# Patient Record
Sex: Male | Born: 1995 | Race: White | Hispanic: No | Marital: Married | Smoking: Never smoker
Health system: Southern US, Community
[De-identification: ages and names within clinical notes are randomized; demographics above are authoritative.]

## PROBLEM LIST (undated history)

## (undated) DIAGNOSIS — I456 Pre-excitation syndrome: Secondary | ICD-10-CM

## (undated) HISTORY — PX: TYMPANOSTOMY TUBE PLACEMENT: SHX32

---

## 2018-04-03 ENCOUNTER — Encounter: Payer: Self-pay | Admitting: Emergency Medicine

## 2018-04-03 ENCOUNTER — Emergency Department: Payer: 59

## 2018-04-03 ENCOUNTER — Emergency Department
Admission: EM | Admit: 2018-04-03 | Discharge: 2018-04-03 | Disposition: A | Discharge status: Home / Self Care | Payer: 59 | Attending: Emergency Medicine | Admitting: Emergency Medicine

## 2018-04-03 ENCOUNTER — Other Ambulatory Visit: Payer: Self-pay

## 2018-04-03 DIAGNOSIS — R002 Palpitations: Secondary | ICD-10-CM | POA: Diagnosis present

## 2018-04-03 HISTORY — DX: Pre-excitation syndrome: I45.6

## 2018-04-03 LAB — CBC
HCT: 43 % (ref 40.0–52.0)
Hemoglobin: 15 g/dL (ref 13.0–18.0)
MCH: 29.9 pg (ref 26.0–34.0)
MCHC: 34.8 g/dL (ref 32.0–36.0)
MCV: 86 fL (ref 80.0–100.0)
PLATELETS: 270 10*3/uL (ref 150–440)
RBC: 5 MIL/uL (ref 4.40–5.90)
RDW: 12.9 % (ref 11.5–14.5)
WBC: 6.1 10*3/uL (ref 3.8–10.6)

## 2018-04-03 LAB — BASIC METABOLIC PANEL
Anion gap: 8 (ref 5–15)
BUN: 20 mg/dL (ref 6–20)
CALCIUM: 9 mg/dL (ref 8.9–10.3)
CHLORIDE: 108 mmol/L (ref 101–111)
CO2: 23 mmol/L (ref 22–32)
CREATININE: 0.86 mg/dL (ref 0.61–1.24)
Glucose, Bld: 93 mg/dL (ref 65–99)
Potassium: 3.7 mmol/L (ref 3.5–5.1)
SODIUM: 139 mmol/L (ref 135–145)

## 2018-04-03 LAB — TROPONIN I: Troponin I: <0.03 ng/mL (ref <0.03)

## 2018-04-03 LAB — TSH: TSH: 2.259 u[IU]/mL (ref 0.350–4.500)

## 2018-04-03 NOTE — ED Notes (Signed)
Pt updated on turn around time for troponin. Pt verbalizes understanding.

## 2018-04-03 NOTE — ED Notes (Signed)
Report to kim, rn.  

## 2018-04-03 NOTE — ED Notes (Signed)
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 Pt states tonight while driving to work he felt palpitations. Pt states he arrived at work and discussed symptoms with EMT at work who encouraged pt to check into ed. Pt states after experiencing palpitations he began to have chest tightness.

## 2018-04-03 NOTE — Discharge Instructions (Addendum)
Please follow-up with cardiology for further evaluation and testing of your heart

## 2018-04-03 NOTE — ED Provider Notes (Signed)
[facility] Emergency Department Provider Note   ____________________________________________   First MD Initiated Contact with Patient 04/03/18 [phone]     (approximate)  I have reviewed the triage vital signs and the nursing notes.   HISTORY  Chief Complaint Palpitations    HPI Patrick Santiago is a 22 y.o. male who comes into the hospital today with some palpitations.  The patient states that they started around 11:20 PM.  He was on his way to work.  He started feeling a fluttering in his chest.  The patient has had these symptoms before but states not in years.  The patient states that initially it was just fluttering but then the second time he had episode it felt a little fast and then the third time he had chest pressure.  The patient states that he called the ambulance and the EMT told him to come and get checked out.  The patient states that since then he is been having these episodes intermittently.  The patient denies any shortness of breath and denies any chest pain at this time.  He has had some nausea with no sweats.  He has been eating and drinking well.  Past Medical History:  Diagnosis Date  . 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   History reviewed. No pertinent surgical history.  Prior to Admission medications   Not on File    Allergies Patient has no known allergies.  No family history on file.  Social History Social History   Tobacco Use  . Smoking status: Never Smoker  . Smokeless tobacco: Never Used  Substance Use Topics  . Alcohol use: Never    Frequency: Never  . Drug use: Never    Review of Systems  Constitutional: No fever/chills Eyes: No visual changes. ENT: No sore throat. Cardiovascular: Palpitations and chest pressure Respiratory: Denies shortness of breath. Gastrointestinal: No abdominal pain.  No nausea, no vomiting.  No  diarrhea.  No constipation. Genitourinary: Negative for dysuria. Musculoskeletal: Negative for back pain. Skin: Negative for rash. Neurological: Negative for headaches, focal weakness or numbness.   ____________________________________________   PHYSICAL EXAM:  VITAL SIGNS: ED Triage Vitals  Enc Vitals Group     BP 04/03/18 0051 122/76     Pulse Rate 04/03/18 0051 80     Resp 04/03/18 0051 18     Temp 04/03/18 0051 98.4 F (36.9 C)     Temp Source 04/03/18 0051 Oral     SpO2 04/03/18 0051 98 %     Weight 04/03/18 0052 125 lb (56.7 kg)     Height 04/03/18 0052  (1.753 m)     Head Circumference --      Peak Flow --      Pain Score 04/03/18 0052 7     Pain Loc --      Pain Edu? --      Excl. in GC? --     Constitutional: Alert and oriented. Well appearing and in no acute distress. Eyes: Conjunctivae are normal. PERRL. EOMI. Head: Atraumatic. Nose: No congestion/rhinnorhea. Mouth/Throat: Mucous membranes are moist.  Oropharynx non-erythematous. Cardiovascular: Normal rate, regular rhythm. Grossly normal heart sounds.  Good peripheral circulation. Respiratory: Normal respiratory effort.  No retractions. Lungs CTAB. Gastrointestinal: Soft and nontender. No distention positive bowel sounds Musculoskeletal: No lower extremity tenderness nor edema.   Neurologic:  Normal speech and language.  Skin:  Skin is  warm, dry and intact.  Psychiatric: Mood and affect are normal.   ____________________________________________   LABS (all labs ordered are listed, but only abnormal results are displayed)  Labs Reviewed  BASIC METABOLIC PANEL  CBC  TROPONIN I  TROPONIN I  TSH   ____________________________________________  EKG  ED ECG REPORT I, Rebecka Apley, the attending physician, personally viewed and interpreted this ECG.   Date: 04/03/2018  EKG Time: 704  Rate: 59  Rhythm: normal sinus rhythm  Axis: normal  Intervals:none  ST&T Change:  none  ____________________________________________  RADIOLOGY  ED MD interpretation:  CXR: no active cardiopulmonary disease  Official radiology report(s): Dg Chest 2 View  Result Date: 04/03/2018 CLINICAL DATA:  Heart palpitations. History of Wolff Parkinson syndrome. EXAM: CHEST - 2 VIEW COMPARISON:  None. FINDINGS: The heart size and mediastinal contours are within normal limits. Both lungs are clear. The visualized skeletal structures are unremarkable. IMPRESSION: No active cardiopulmonary disease. Electronically Signed   By: Tollie Eth M.D.   On: 04/03/2018 01:46    ____________________________________________   PROCEDURES  Procedure(s) performed: None  Procedures  Critical Care performed: No  ____________________________________________   INITIAL IMPRESSION / ASSESSMENT AND PLAN / ED COURSE  As part of my medical decision making, I reviewed the following data within the electronic MEDICAL RECORD NUMBER Notes from prior ED visits and [place] Controlled Substance Database   This is a 22 year old male who comes into the hospital today with palpitations.  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  My differential diagnosis includes acute coronary syndrome, arrhythmia, SVT  The patient did receive an EKG which showed a sinus rhythm with no tachycardia.  The patient also has not had any PVCs.  He has not had any increased caffeine intake.  We will check some blood work on the patient to include a CBC BMP and a troponin which was all negative.  The patient had a chest x-ray also that was negative.  The patient will be discharged home to follow-up with cardiology.      ____________________________________________   FINAL CLINICAL IMPRESSION(S) / ED DIAGNOSES  Final diagnoses:  Palpitations     ED Discharge Orders    None       Note:  This document was prepared using Dragon voice recognition software and may include unintentional dictation  errors.    Rebecka Apley, MD 04/03/18 [phone]

## 2018-04-03 NOTE — ED Triage Notes (Signed)
Pt reports that when he was on the way to work this evening, he began to experience palpitations. Pt has hx of Wolf Parkinson's syndrome. Pt is ambulatory to triage and in NAD.

## 2018-04-15 ENCOUNTER — Other Ambulatory Visit: Payer: Self-pay

## 2018-04-15 ENCOUNTER — Emergency Department: Payer: 59

## 2018-04-15 ENCOUNTER — Emergency Department
Admission: EM | Admit: 2018-04-15 | Discharge: 2018-04-15 | Disposition: A | Discharge status: Home / Self Care | Payer: 59 | Attending: Emergency Medicine | Admitting: Emergency Medicine

## 2018-04-15 DIAGNOSIS — R0789 Other chest pain: Secondary | ICD-10-CM | POA: Insufficient documentation

## 2018-04-15 NOTE — Discharge Instructions (Addendum)
Return to the ER for new, worsening, or persistent severe chest pain especially sustained chest pain, difficulty breathing, lightheadedness, or any other new or worsening symptoms that concern you.  Follow-up with Dr. Juliann Pares as scheduled.  Make sure you are getting enough rest, drinking plenty of fluids, eating regularly, and you should avoid caffeine, excessive alcohol, or drugs, all of which could cause worsening of your symptoms.

## 2018-04-15 NOTE — ED Triage Notes (Signed)
Pt arrives to ED via POV from work with c/o CP. Pt seen here recently for palpitations; has an appt for testing with Cardiologist June 6th. Pt reports some dizziness, but denies N/V/D. Pt is ambulatory, in NAD; RR even, regular, and unlabored.

## 2018-04-15 NOTE — ED Provider Notes (Signed)
[facility] Emergency Department Provider Note ____________________________________________   First MD Initiated Contact with Patient 04/15/18 [phone]     (approximate)  I have reviewed the triage vital signs and the nursing notes.   HISTORY  Chief Complaint Chest Pain    HPI Patrick Santiago is a 22 y.o. male with history of WPW who presents with chest pain, acute onset approximately 3 hours ago, described as a sharp pinprick type sensation occurring a few times, and now resolved.  Patient has had palpitations for a few weeks, and was previously seen in the ED.  He saw Dr. Juliann Pares, and has actually been wearing a Holter monitor for the last few days, but he took it off right before he went to work tonight (and thus right before the pain started).  He reports still having the palpitations now, but denies any other acute symptoms.  Past Medical History:  Diagnosis Date  . 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   History reviewed. No pertinent surgical history.  Prior to Admission medications   Not on File    Allergies Patient has no known allergies.  No family history on file.  Social History Social History   Tobacco Use  . Smoking status: Never Smoker  . Smokeless tobacco: Never Used  Substance Use Topics  . Alcohol use: Never    Frequency: Never  . Drug use: Never    Review of Systems  Constitutional: No fever. Eyes: No v redness. ENT: No neck pain. Cardiovascular: Positive for resolved chest pain. Respiratory: Denies shortness of breath. Gastrointestinal: No nausea or vomiting. Genitourinary: Negative for flank pain.  Musculoskeletal: Negative for back pain. Skin: Negative for rash. Neurological: Negative for lightheadedness.   ____________________________________________   PHYSICAL EXAM:  VITAL SIGNS: ED Triage Vitals  Enc Vitals Group     BP  04/15/18 0330 130/66     Pulse Rate 04/15/18 0330 68     Resp 04/15/18 0330 18     Temp 04/15/18 0330 98.4 F (36.9 C)     Temp Source 04/15/18 0330 Oral     SpO2 04/15/18 0330 100 %     Weight 04/15/18 0319 126 lb (57.2 kg)     Height 04/15/18 0319  (1.753 m)     Head Circumference --      Peak Flow --      Pain Score 04/15/18 0319 4     Pain Loc --      Pain Edu? --      Excl. in GC? --     Constitutional: Alert and oriented.  Anxious but otherwise well-appearing. Eyes: Conjunctivae are normal.  Head: Atraumatic. Nose: No congestion/rhinnorhea. Mouth/Throat: Mucous membranes are moist.   Neck: Normal range of motion.  Cardiovascular: Normal rate, regular rhythm. Grossly normal heart sounds.  Good peripheral circulation. Respiratory: Normal respiratory effort.  No retractions. Lungs CTAB. Gastrointestinal:  No distention.  Musculoskeletal: No lower extremity edema.  Extremities warm and well perfused.  No calf or popliteal swelling or tenderness. Neurologic:  Normal speech and language. No gross focal neurologic deficits are appreciated.  Skin:  Skin is warm and dry. No rash noted. Psychiatric: Mood and affect are normal. Speech and behavior are normal.  Anxious appearing.  ____________________________________________   LABS (all labs ordered are listed, but only abnormal results are displayed)  Labs Reviewed - No data to display ____________________________________________  EKG  ED  ECG REPORT I, Dionne Bucy, the attending physician, personally viewed and interpreted this ECG.  Date: 04/15/2018 EKG Time: 328 Rate: 66 Rhythm: normal sinus rhythm QRS Axis: normal Intervals: Short PR ST/T Wave abnormalities: normal Narrative Interpretation: no evidence of acute ischemia; no significant change when compared to EKG of 04/03/2018  ED ECG REPORT I, Dionne Bucy, the attending physician, personally viewed and interpreted this ECG.  Date:  04/15/2018 EKG Time: 506 Rate: 65 Rhythm: normal sinus rhythm QRS Axis: normal Intervals: Short PR ST/T Wave abnormalities: normal Narrative Interpretation: No dynamic changes when compared to EKG of 328 today     ____________________________________________  RADIOLOGY  CXR: No focal infiltrate or other acute findings  ____________________________________________   PROCEDURES  Procedure(s) performed: No  Procedures  Critical Care performed: No ____________________________________________   INITIAL IMPRESSION / ASSESSMENT AND PLAN / ED COURSE  Pertinent labs & imaging results that were available during my care of the patient were reviewed by me and considered in my medical decision making (see chart for details).  22 year old male with PMH as noted above presents with palpitations and very atypical chest pain acute onset a few hours ago and now resolved.  Patient is currently undergoing a cardiology work-up for palpitations, and was actually wearing a Holter monitor until right before the symptoms started this evening.  He has cardiology follow-up arranged in 6 days, and has a stress test and echocardiogram scheduled.  On exam, the vital signs are normal, the patient is extremely anxious appearing, but the remainder the exam is unremarkable.  Overall presentation is consistent with an exacerbation of patient's chronic palpitations.  Given his age, lack of ACS risk factors, lack of EKG changes, and the highly atypical nature of the pain, there is no clinical evidence for ACS.  We discussed obtaining basic labs and a troponin to fully rule out ACS, however the patient reports that he has extreme anxiety related to having his blood drawn and would strongly prefer not to have a blood draw unless absolutely necessary.  Given the low risk for ACS as noted above, I feel that it would be reasonable to observe the patient briefly and obtain a repeat EKG and forego blood at this time.   The patient agrees with this plan.    ----------------------------------------- 5:19 AM on 04/15/2018 -----------------------------------------  On reassessment, patient's symptoms have resolved.  He is much more comfortable appearing.  Repeat EKG is unchanged.  At this time, the patient is stable for discharge home.  I had a thorough discussion with the patient about the results of his work-up and the follow-up plan.  Return precautions given, and the patient expresses understanding.  ____________________________________________   FINAL CLINICAL IMPRESSION(S) / ED DIAGNOSES  Final diagnoses:  Atypical chest pain      NEW MEDICATIONS STARTED DURING THIS VISIT:  New Prescriptions   No medications on file     Note:  This document was prepared using Dragon voice recognition software and may include unintentional dictation errors.    Dionne Bucy, MD 04/15/18 [phone]

## 2019-04-04 IMAGING — CR DG CHEST 2V
1 series · 2 of 2 positions shown · non-contrast
Comparison: 04/03/2018 chest radiograph.

CLINICAL DATA: 22 y/o  M; chest pain and dizziness.

EXAM:
CHEST - 2 VIEW

[Series 1: dg chest 2 view · 0.14mm/px · 2 of 2 slices shown]
[im 1/2]
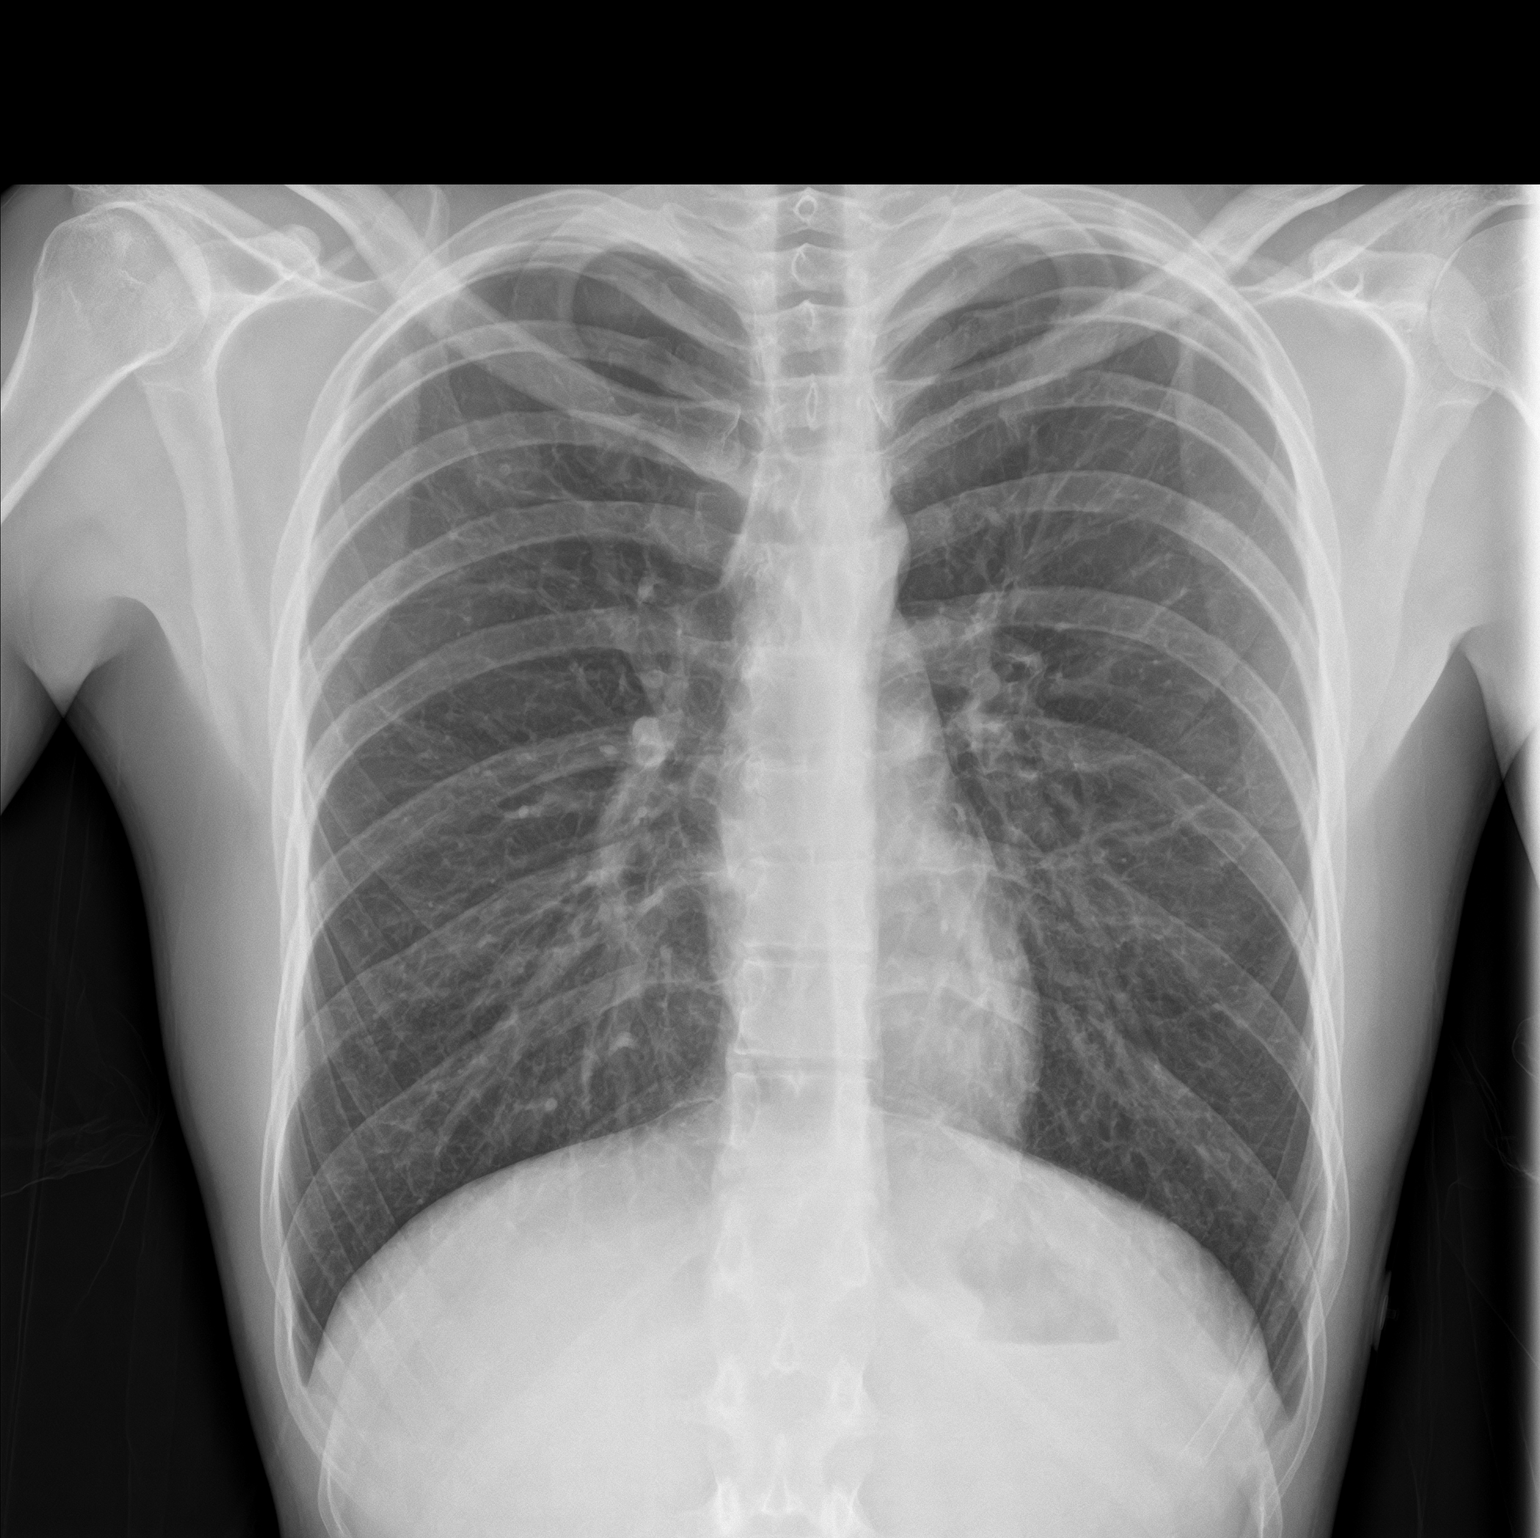
[im 2/2]
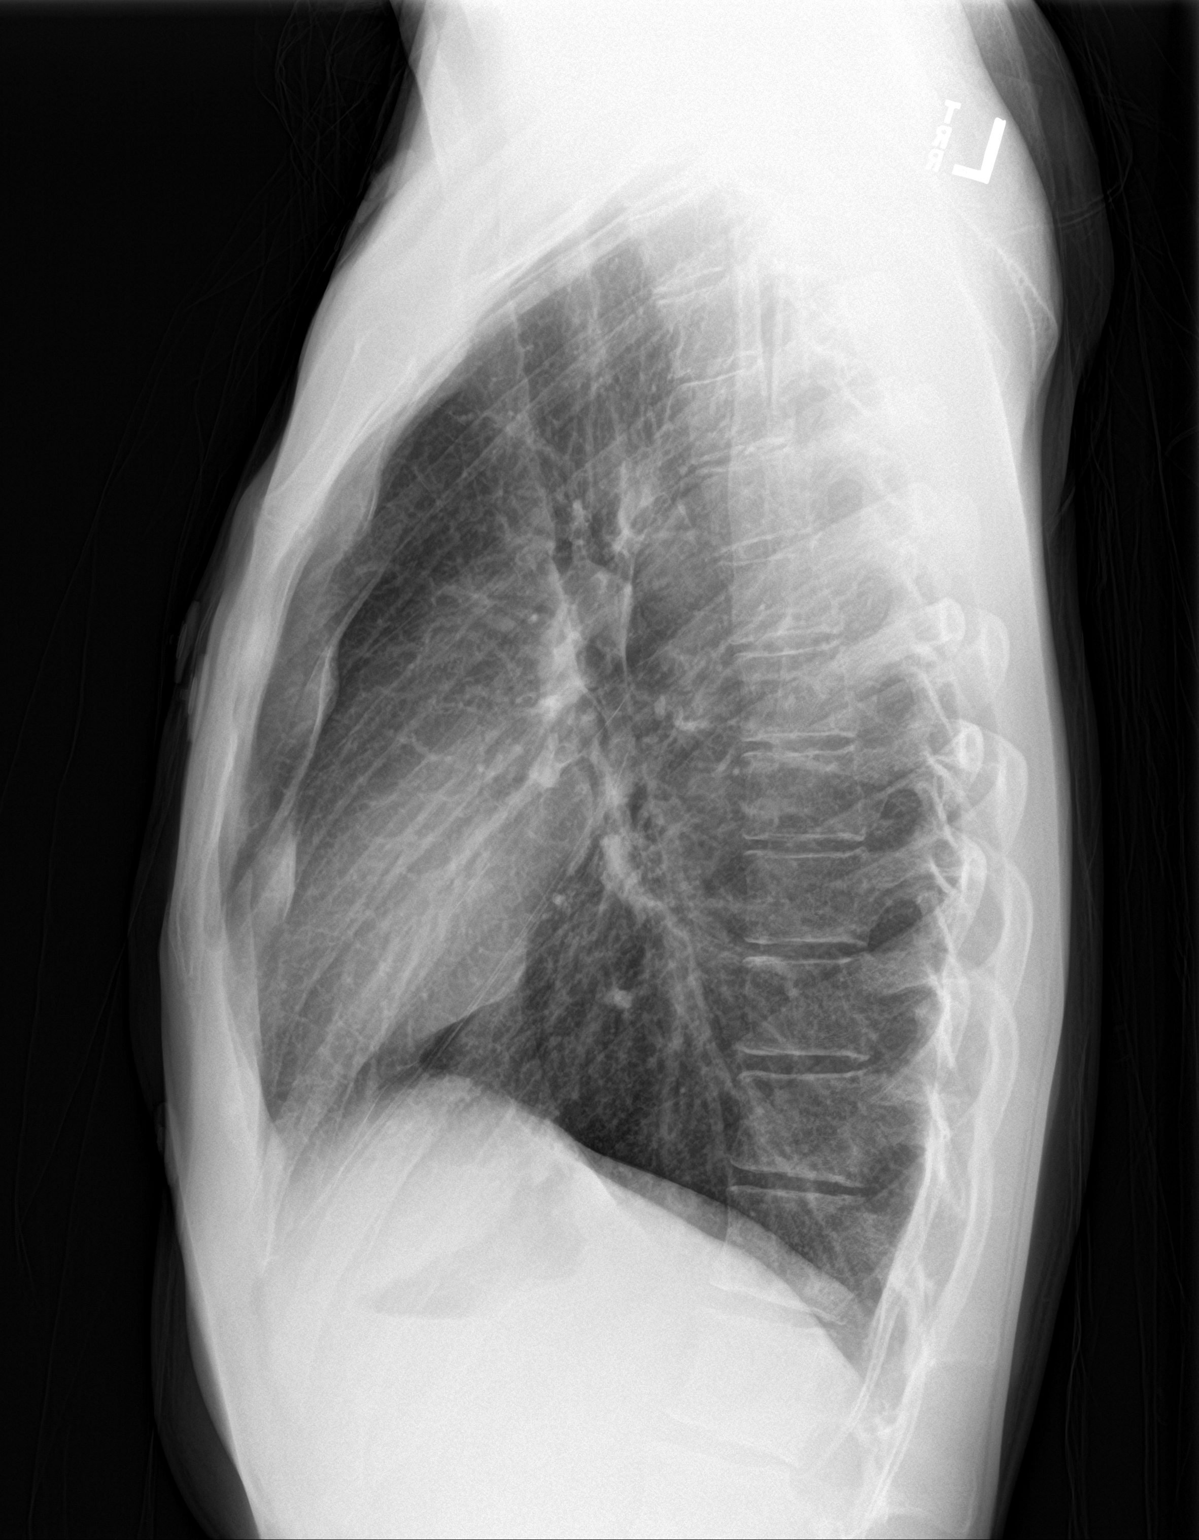

[2 of 2 positions shown; findings below may reference images not displayed]

FINDINGS: Stable heart size and mediastinal contours are within normal limits.
Both lungs are clear. The visualized skeletal structures are
unremarkable.
IMPRESSION: No acute pulmonary process identified.

By: Exodus Antonio Bisto M.D.

## 2019-05-10 ENCOUNTER — Telehealth: Payer: Self-pay | Admitting: *Deleted

## 2019-05-10 ENCOUNTER — Other Ambulatory Visit: Payer: 59

## 2019-05-10 DIAGNOSIS — Z20822 Contact with and (suspected) exposure to covid-19: Secondary | ICD-10-CM

## 2019-05-10 NOTE — Telephone Encounter (Signed)
Pt referred by [facility].  No PCP.  Sore throat, cough, temp 99.2, congestion  Scheduled for testing at [facility] site today.  Testing process reviewed, stay in car, wear mask; pt verbalizes understanding.

## 2020-02-08 ENCOUNTER — Ambulatory Visit
Admission: EM | Admit: 2020-02-08 | Discharge: 2020-02-08 | Disposition: A | Discharge status: Home / Self Care | Payer: 59 | Attending: Urgent Care | Admitting: Urgent Care

## 2020-02-08 ENCOUNTER — Other Ambulatory Visit: Payer: Self-pay

## 2020-02-08 ENCOUNTER — Ambulatory Visit: Payer: 59 | Attending: Internal Medicine

## 2020-02-08 ENCOUNTER — Encounter: Payer: Self-pay | Admitting: Emergency Medicine

## 2020-02-08 DIAGNOSIS — B349 Viral infection, unspecified: Secondary | ICD-10-CM

## 2020-02-08 DIAGNOSIS — R0981 Nasal congestion: Secondary | ICD-10-CM | POA: Diagnosis not present

## 2020-02-08 DIAGNOSIS — Z20822 Contact with and (suspected) exposure to covid-19: Secondary | ICD-10-CM

## 2020-02-08 DIAGNOSIS — M791 Myalgia, unspecified site: Secondary | ICD-10-CM

## 2020-02-08 DIAGNOSIS — M542 Cervicalgia: Secondary | ICD-10-CM | POA: Diagnosis not present

## 2020-02-08 DIAGNOSIS — R509 Fever, unspecified: Secondary | ICD-10-CM | POA: Diagnosis not present

## 2020-02-08 DIAGNOSIS — Z8679 Personal history of other diseases of the circulatory system: Secondary | ICD-10-CM

## 2020-02-08 DIAGNOSIS — R519 Headache, unspecified: Secondary | ICD-10-CM

## 2020-02-08 DIAGNOSIS — R5381 Other malaise: Secondary | ICD-10-CM

## 2020-02-08 DIAGNOSIS — Z00110 Health examination for newborn under 8 days old: Secondary | ICD-10-CM

## 2020-02-08 DIAGNOSIS — R42 Dizziness and giddiness: Secondary | ICD-10-CM

## 2020-02-08 MED ORDER — BENZONATATE 100 MG PO CAPS: 100.0000 mg | ORAL_CAPSULE | Freq: Three times a day (TID) | ORAL | 0 refills | Status: AC | PRN

## 2020-02-08 MED ORDER — PROMETHAZINE-DM 6.25-15 MG/5ML PO SYRP: 5.0000 mL | ORAL_SOLUTION | Freq: Every evening | ORAL | 0 refills | Status: AC | PRN

## 2020-02-08 NOTE — Discharge Instructions (Signed)

## 2020-02-08 NOTE — ED Triage Notes (Signed)
Pt c/o nausea, dizziness, left hand numbness, vomiting, headache, neck stiffness, and low grade fever (99.5). He had a covid test this morning. Started yesterday afternoon. The only symptoms that he is having today is nausea, dizziness (with position change), headache and photosensitivity.

## 2020-02-08 NOTE — ED Provider Notes (Signed)
[facility]   MRN: [id number] DOB: [birth year]  Subjective:   Patrick Santiago is a 24 y.o. male presenting with pmh of WPW for 1 day hx of acute onset malaise, ROS as below. Of note, patient did have exposure to COVID 19, testing was completed today and is pending.   No current facility-administered medications for this encounter. No current outpatient medications on file.   No Known Allergies  Past Medical History:  Diagnosis Date  . 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 No pertinent surgical history.  No family history on file.  Social History   Tobacco Use  . Smoking status: Never Smoker  . Smokeless tobacco: Never Used  Substance Use Topics  . Alcohol use: Never  . Drug use: Never    Review of Systems  Constitutional: Positive for fever (subjective). Negative for malaise/fatigue.  HENT: Positive for congestion (With yellow mucus this morning). Negative for ear pain, sinus pain and sore throat.   Eyes: Positive for photophobia. Negative for discharge and redness.  Respiratory: Negative for cough, hemoptysis, shortness of breath and wheezing.   Cardiovascular: Negative for chest pain.  Gastrointestinal: Positive for vomiting. Negative for abdominal pain, diarrhea and nausea.  Genitourinary: Negative for dysuria, flank pain and hematuria.  Musculoskeletal: Positive for myalgias and neck pain.  Skin: Negative for rash.  Neurological: Positive for dizziness and headaches. Negative for weakness.  Psychiatric/Behavioral: Negative for depression and substance abuse.     Objective:   Vitals: BP 112/66 (BP Location: Left Arm)   Pulse 77   Temp 98.2 F (36.8 C) (Oral)   Resp 18   Ht 5\' 9"  (1.753 m)   Wt 130 lb (59 kg)   SpO2 96%   BMI 19.20 kg/m   Physical Exam Constitutional:      General: He is not in acute distress.    Appearance: Normal appearance. He is well-developed and normal weight. He is not  ill-appearing, toxic-appearing or diaphoretic.  HENT:     Head: Normocephalic and atraumatic.     Right Ear: Tympanic membrane, ear canal and external ear normal. There is no impacted cerumen.     Left Ear: Tympanic membrane, ear canal and external ear normal. There is no impacted cerumen.     Nose: Nose normal. No congestion or rhinorrhea.     Mouth/Throat:     Mouth: Mucous membranes are moist.     Pharynx: Oropharynx is clear. No oropharyngeal exudate or posterior oropharyngeal erythema.  Eyes:     General: No scleral icterus.       Right eye: No discharge.        Left eye: No discharge.     Extraocular Movements: Extraocular movements intact.     Conjunctiva/sclera: Conjunctivae normal.     Pupils: Pupils are equal, round, and reactive to light.  Cardiovascular:     Rate and Rhythm: Normal rate and regular rhythm.     Heart sounds: Normal heart sounds. No murmur. No friction rub. No gallop.   Pulmonary:     Effort: Pulmonary effort is normal. No respiratory distress.     Breath sounds: Normal breath sounds. No stridor. No wheezing, rhonchi or rales.  Abdominal:     General: Bowel sounds are normal. There is no distension.     Palpations: Abdomen is soft. There is no mass.     Tenderness: There is no abdominal tenderness. There is no guarding or rebound.  Musculoskeletal:  Cervical back: Normal range of motion and neck supple. No rigidity. No muscular tenderness.  Skin:    General: Skin is warm and dry.  Neurological:     General: No focal deficit present.     Mental Status: He is alert and oriented to person, place, and time.     Cranial Nerves: No cranial nerve deficit.     Motor: No weakness.     Coordination: Coordination normal.     Gait: Gait normal.     Deep Tendon Reflexes: Reflexes normal.  Psychiatric:        Mood and Affect: Mood normal.        Behavior: Behavior normal.        Thought Content: Thought content normal.        Judgment: Judgment normal.       Assessment and Plan :   1. Viral illness   2. Exposure to COVID-19 virus   3. Health supervision for newborn under 80 days old   4. Malaise   5. Dizziness   6. Generalized headaches   7. History of Wolff-Parkinson-White (WPW) syndrome     High suspicion for COVID-19 given his multiple exposures at work.  Discussed with patient that he is high risk given his history of Wolff-Parkinson-White syndrome.  He states he has been controlled in the last 4 months and follows up regularly with his provider.  We agreed to defer EKG for now given normal vital signs, reassuring physical exam findings.  Recommended supportive care for now.  Emphasized strict precautions for return to clinic and ER including high fevers, chest pain, shortness of breath, heart racing and palpitations, diaphoresis.  Otherwise, patient will be held from work until his Covid test results are back.  Counseled that if they are positive he would be in quarantine until February 17, 2020. Counseled patient on potential for adverse effects with medications prescribed/recommended today, ER and return-to-clinic precautions discussed, patient verbalized understanding.    Jaynee Eagles, PA-C 02/08/20 1057

## 2020-02-09 LAB — SARS-COV-2, NAA 2 DAY TAT

## 2020-02-09 LAB — NOVEL CORONAVIRUS, NAA: SARS-CoV-2, NAA: NOT DETECTED

## 2020-04-30 ENCOUNTER — Ambulatory Visit
Admission: EM | Admit: 2020-04-30 | Discharge: 2020-04-30 | Disposition: A | Discharge status: Home / Self Care | Payer: BC Managed Care – PPO | Attending: Emergency Medicine | Admitting: Emergency Medicine

## 2020-04-30 ENCOUNTER — Other Ambulatory Visit: Payer: Self-pay

## 2020-04-30 ENCOUNTER — Encounter: Payer: Self-pay | Admitting: Emergency Medicine

## 2020-04-30 DIAGNOSIS — J069 Acute upper respiratory infection, unspecified: Secondary | ICD-10-CM | POA: Insufficient documentation

## 2020-04-30 DIAGNOSIS — B349 Viral infection, unspecified: Secondary | ICD-10-CM | POA: Insufficient documentation

## 2020-04-30 LAB — POCT RAPID STREP A (OFFICE): Rapid Strep A Screen: NEGATIVE

## 2020-04-30 NOTE — Discharge Instructions (Signed)
Your rapid strep test is negative.  A throat culture is pending; we will call you if it is positive requiring treatment.    Your COVID test is pending.  You should self quarantine until the test result is back.    Take Tylenol as needed for fever or discomfort.  Rest and keep yourself hydrated.    Go to the emergency department if you develop shortness of breath, severe diarrhea, high fever not relieved by Tylenol or ibuprofen, or other concerning symptoms.    

## 2020-04-30 NOTE — ED Provider Notes (Signed)
Patrick Santiago    CSN: [id number] Arrival date & time: 04/30/20  1448      History   Chief Complaint Chief Complaint  Patient presents with  . Sore Throat  . post nasal drip    HPI Patrick Santiago is a 24 y.o. male.   Patient presents with sore throat, ear pain, postnasal drip, nonproductive cough, fever, diarrhea x3 days.  No episodes of diarrhea today; 2 yesterday.  T-max 101.7.  He denies nausea, vomiting, rash, or other symptoms.  Treatment attempted at home with Tylenol.  The history is provided by the patient.    Past Medical History:  Diagnosis Date  . Evelene Croon Parkinson White pattern seen on electrocardiogram     There are no problems to display for this patient.   Past Surgical History:  Procedure Laterality Date  . TYMPANOSTOMY TUBE PLACEMENT         Home Medications    Prior to Admission medications   Medication Sig Start Date End Date Taking? Authorizing Provider  benzonatate (TESSALON) 100 MG capsule Take 1-2 capsules (100-200 mg total) by mouth 3 (three) times daily as needed. 02/08/20   Wallis Bamberg, PA-C  promethazine-dextromethorphan (PROMETHAZINE-DM) 6.25-15 MG/5ML syrup Take 5 mLs by mouth at bedtime as needed for cough. 02/08/20   Wallis Bamberg, PA-C    Family History Family History  Problem Relation Age of Onset  . Cancer Mother   . Healthy Father     Social History Social History   Tobacco Use  . Smoking status: Never Smoker  . Smokeless tobacco: Never Used  Vaping Use  . Vaping Use: Never used  Substance Use Topics  . Alcohol use: Never  . Drug use: Never     Allergies   Patient has no known allergies.   Review of Systems Review of Systems  Constitutional: Positive for fever. Negative for chills.  HENT: Positive for ear pain, postnasal drip and sore throat.   Eyes: Negative for pain and visual disturbance.  Respiratory: Positive for cough. Negative for shortness of breath.   Cardiovascular: Negative for chest pain and  palpitations.  Gastrointestinal: Positive for diarrhea. Negative for abdominal pain, nausea and vomiting.  Genitourinary: Negative for dysuria and hematuria.  Musculoskeletal: Negative for arthralgias and back pain.  Skin: Negative for color change and rash.  Neurological: Negative for seizures and syncope.  All other systems reviewed and are negative.    Physical Exam Triage Vital Signs ED Triage Vitals  Enc Vitals Group     BP 04/30/20 0830 104/66     Pulse Rate 04/30/20 0830 82     Resp 04/30/20 0830 18     Temp 04/30/20 0830 98.2 F (36.8 C)     Temp Source 04/30/20 0830 Oral     SpO2 04/30/20 0830 97 %     Weight 04/30/20 0831 130 lb (59 kg)     Height 04/30/20 0831 5\' 9"  (1.753 m)     Head Circumference --      Peak Flow --      Pain Score 04/30/20 0830 6     Pain Loc --      Pain Edu? --      Excl. in GC? --    No data found.  Updated Vital Signs BP 104/66 (BP Location: Left Arm)   Pulse 82   Temp 98.2 F (36.8 C) (Oral)   Resp 18   Ht 5\' 9"  (1.753 m)   Wt 130 lb (59 kg)  SpO2 97%   BMI 19.20 kg/m   Visual Acuity Right Eye Distance:   Left Eye Distance:   Bilateral Distance:    Right Eye Near:   Left Eye Near:    Bilateral Near:     Physical Exam Vitals and nursing note reviewed.  Constitutional:      General: He is not in acute distress.    Appearance: He is well-developed.  HENT:     Head: Normocephalic and atraumatic.     Right Ear: Tympanic membrane normal.     Left Ear: Tympanic membrane normal.     Nose: Nose normal.     Mouth/Throat:     Mouth: Mucous membranes are moist.     Pharynx: Posterior oropharyngeal erythema present.  Eyes:     Conjunctiva/sclera: Conjunctivae normal.  Cardiovascular:     Rate and Rhythm: Normal rate and regular rhythm.     Heart sounds: No murmur heard.   Pulmonary:     Effort: Pulmonary effort is normal. No respiratory distress.     Breath sounds: Normal breath sounds. No wheezing or rhonchi.    Abdominal:     General: Bowel sounds are normal.     Palpations: Abdomen is soft.     Tenderness: There is no abdominal tenderness. There is no guarding or rebound.  Musculoskeletal:     Cervical back: Neck supple.     Right lower leg: No edema.     Left lower leg: No edema.  Skin:    General: Skin is warm and dry.     Findings: No rash.  Neurological:     General: No focal deficit present.     Mental Status: He is alert and oriented to person, place, and time.     Gait: Gait normal.  Psychiatric:        Mood and Affect: Mood normal.        Behavior: Behavior normal.      UC Treatments / Results  Labs (all labs ordered are listed, but only abnormal results are displayed) Labs Reviewed  CULTURE, GROUP A STREP ([place])  NOVEL CORONAVIRUS, NAA  POCT RAPID STREP A (OFFICE)    EKG   Radiology No results found.  Procedures Procedures (including critical care time)  Medications Ordered in UC Medications - No data to display  Initial Impression / Assessment and Plan / UC Course  I have reviewed the triage vital signs and the nursing notes.  Pertinent labs & imaging results that were available during my care of the patient were reviewed by me and considered in my medical decision making (see chart for details).   Viral illness, URI.  Rapid strep negative; throat culture pending.  PCR COVID test performed here.  Instructed patient to self quarantine until the test result is back.  Discussed with patient that he can take Tylenol as needed for fever or discomfort.  Instructed patient to go to the emergency department if he develops high fever, shortness of breath, severe diarrhea, or other concerning symptoms.  Patient agrees with plan of care.     Final Clinical Impressions(s) / UC Diagnoses   Final diagnoses:  Upper respiratory tract infection, unspecified type  Viral illness     Discharge Instructions     Your rapid strep test is negative.  A throat culture is  pending; we will call you if it is positive requiring treatment.    Your COVID test is pending.  You should self quarantine until the test result is  back.    Take Tylenol as needed for fever or discomfort.  Rest and keep yourself hydrated.    Go to the emergency department if you develop shortness of breath, severe diarrhea, high fever not relieved by Tylenol or ibuprofen, or other concerning symptoms.       ED Prescriptions    None     PDMP not reviewed this encounter.   Mickie Bail, NP 04/30/20 [phone]

## 2020-04-30 NOTE — ED Triage Notes (Signed)
Patient in today c/o sore throat and PND x 2 days. Patient states fever this morning 101.7. Patient took Tylenol at ~7:30am this morning. Patient has not had the covid vaccines.

## 2020-05-01 LAB — NOVEL CORONAVIRUS, NAA: SARS-CoV-2, NAA: DETECTED — AB

## 2020-05-01 LAB — SARS-COV-2, NAA 2 DAY TAT

## 2020-05-02 LAB — CULTURE, GROUP A STREP (THRC)

## 2023-05-08 ENCOUNTER — Encounter: Payer: Self-pay | Admitting: Emergency Medicine

## 2023-05-08 ENCOUNTER — Emergency Department
Admission: EM | Admit: 2023-05-08 | Discharge: 2023-05-08 | Disposition: A | Discharge status: Home / Self Care | Payer: Worker's Compensation | Attending: Emergency Medicine | Admitting: Emergency Medicine

## 2023-05-08 ENCOUNTER — Other Ambulatory Visit: Payer: Self-pay

## 2023-05-08 ENCOUNTER — Emergency Department: Payer: Worker's Compensation

## 2023-05-08 DIAGNOSIS — M25551 Pain in right hip: Secondary | ICD-10-CM | POA: Diagnosis present

## 2023-05-08 DIAGNOSIS — S7001XA Contusion of right hip, initial encounter: Secondary | ICD-10-CM | POA: Diagnosis not present

## 2023-05-08 DIAGNOSIS — W208XXA Other cause of strike by thrown, projected or falling object, initial encounter: Secondary | ICD-10-CM | POA: Insufficient documentation

## 2023-05-08 MED ORDER — NAPROXEN 500 MG PO TABS: 500.0000 mg | ORAL_TABLET | Freq: Two times a day (BID) | ORAL | 0 refills | Status: AC

## 2023-05-08 NOTE — ED Triage Notes (Signed)
Presents with co-worker   States he had a piece of sheet metal drop onto him  Having pain to right hip area

## 2023-05-08 NOTE — ED Notes (Signed)
See triage note  Presents with pain to right hip area  States another co-worker dropped some sheet metal   hitting is right hip  No deformity noted

## 2023-05-08 NOTE — Discharge Instructions (Addendum)
Your x-ray did not show any concern for fracture.  Use ice over the sore area off and on today.  Take Naprosyn as prescribed.

## 2023-05-08 NOTE — ED Provider Notes (Signed)
   [facility] Provider Note    Event Date/Time   First MD Initiated Contact with Patient 05/08/23 [phone]     (approximate)   History   Hip Pain   HPI  Patrick Santiago is a 27 y.o. male with no significant past medical history and as listed in EMR presents to the emergency department for treatment and evaluation of right hip pain after a piece of sheet metal dropped onto him.Marland Kitchen      Physical Exam   Triage Vital Signs: ED Triage Vitals  Enc Vitals Group     BP 05/08/23 0817 113/66     Pulse Rate 05/08/23 0817 68     Resp 05/08/23 0817 17     Temp 05/08/23 0817 97.7 F (36.5 C)     Temp Source 05/08/23 0817 Oral     SpO2 05/08/23 0817 100 %     Weight 05/08/23 0813 130 lb 1.1 oz (59 kg)     Height 05/08/23 0813 5\' 9"  (1.753 m)     Head Circumference --      Peak Flow --      Pain Score --      Pain Loc --      Pain Edu? --      Excl. in GC? --     Most recent vital signs: Vitals:   05/08/23 0817  BP: 113/66  Pulse: 68  Resp: 17  Temp: 97.7 F (36.5 C)  SpO2: 100%    General: Awake, no distress.  CV:  Good peripheral perfusion.  Resp:  Normal effort.  Abd:  No distention.  Other:  Erythematous area over right ilium/upper thigh   ED Results / Procedures / Treatments   Labs (all labs ordered are listed, but only abnormal results are displayed) Labs Reviewed - No data to display   EKG  Not indicated   RADIOLOGY  Image and radiology report reviewed and interpreted by me. Radiology report consistent with the same.  No acute bony abnormality of the right hip noted   PROCEDURES:  Critical Care performed: No  Procedures   MEDICATIONS ORDERED IN ED:  Medications - No data to display   IMPRESSION / MDM / ASSESSMENT AND PLAN / ED COURSE   I have reviewed the triage note.  Differential diagnosis includes, but is not limited to, avulsion fracture, contusion  Patient's presentation is most consistent with acute  complicated illness / injury requiring diagnostic workup.  27 year old male presenting to the emergency department for evaluation after a metal object fell on his hip.  See HPI for further details.  X-ray is reassuring.  He will be discharged home with a prescription for Naprosyn and encouraged to rest and apply ice off-and-on throughout the day.      FINAL CLINICAL IMPRESSION(S) / ED DIAGNOSES   Final diagnoses:  Contusion of right hip, initial encounter     Rx / DC Orders   ED Discharge Orders          Ordered    naproxen (NAPROSYN) 500 MG tablet  2 times daily with meals        05/08/23 1022             Note:  This document was prepared using Dragon voice recognition software and may include unintentional dictation errors.   Chinita Pester, FNP 05/08/23 1027    Merwyn Katos, MD 05/08/23 [phone]
